# Patient Record
Sex: Male | Born: 1985 | Race: White | Hispanic: No | Marital: Single | Smoking: Never smoker
Health system: Southern US, Community
[De-identification: ages and names within clinical notes are randomized; demographics above are authoritative.]

---

## 2016-04-06 ENCOUNTER — Encounter (HOSPITAL_COMMUNITY): Payer: Self-pay | Admitting: Emergency Medicine

## 2016-04-06 ENCOUNTER — Emergency Department (HOSPITAL_COMMUNITY): Payer: 59

## 2016-04-06 ENCOUNTER — Emergency Department (HOSPITAL_COMMUNITY)
Admission: EM | Admit: 2016-04-06 | Discharge: 2016-04-06 | Disposition: A | Discharge status: Home / Self Care | Payer: 59 | Attending: Emergency Medicine | Admitting: Emergency Medicine

## 2016-04-06 DIAGNOSIS — R1031 Right lower quadrant pain: Secondary | ICD-10-CM | POA: Diagnosis present

## 2016-04-06 DIAGNOSIS — N201 Calculus of ureter: Secondary | ICD-10-CM | POA: Diagnosis not present

## 2016-04-06 LAB — URINALYSIS, ROUTINE W REFLEX MICROSCOPIC
Bilirubin Urine: NEGATIVE
Glucose, UA: NEGATIVE mg/dL
Hgb urine dipstick: NEGATIVE
Ketones, ur: NEGATIVE mg/dL
Leukocytes, UA: NEGATIVE
NITRITE: NEGATIVE
Protein, ur: 30 mg/dL — AB
SPECIFIC GRAVITY, URINE: 1.029 (ref 1.005–1.030)
pH: 8.5 — ABNORMAL HIGH (ref 5.0–8.0)

## 2016-04-06 LAB — URINE MICROSCOPIC-ADD ON

## 2016-04-06 LAB — CBC
HCT: 41.8 % (ref 39.0–52.0)
HEMOGLOBIN: 15 g/dL (ref 13.0–17.0)
MCH: 30.4 pg (ref 26.0–34.0)
MCHC: 35.9 g/dL (ref 30.0–36.0)
MCV: 84.8 fL (ref 78.0–100.0)
Platelets: 204 10*3/uL (ref 150–400)
RBC: 4.93 MIL/uL (ref 4.22–5.81)
RDW: 12.4 % (ref 11.5–15.5)
WBC: 5.9 10*3/uL (ref 4.0–10.5)

## 2016-04-06 LAB — COMPREHENSIVE METABOLIC PANEL
ALK PHOS: 56 U/L (ref 38–126)
ALT: 19 U/L (ref 17–63)
ANION GAP: 12 (ref 5–15)
AST: 24 U/L (ref 15–41)
Albumin: 4.7 g/dL (ref 3.5–5.0)
BILIRUBIN TOTAL: 0.4 mg/dL (ref 0.3–1.2)
BUN: 18 mg/dL (ref 6–20)
CALCIUM: 9.6 mg/dL (ref 8.9–10.3)
CO2: 22 mmol/L (ref 22–32)
Chloride: 105 mmol/L (ref 101–111)
Creatinine, Ser: 1.4 mg/dL — ABNORMAL HIGH (ref 0.61–1.24)
GFR calc non Af Amer: >60 mL/min (ref >60)
Glucose, Bld: 137 mg/dL — ABNORMAL HIGH (ref 65–99)
Potassium: 4.1 mmol/L (ref 3.5–5.1)
SODIUM: 139 mmol/L (ref 135–145)
TOTAL PROTEIN: 7.6 g/dL (ref 6.5–8.1)

## 2016-04-06 LAB — LIPASE, BLOOD: Lipase: 24 U/L (ref 11–51)

## 2016-04-06 MED ORDER — OXYCODONE-ACETAMINOPHEN 5-325 MG PO TABS: 1.0000 | ORAL_TABLET | ORAL | Status: AC | PRN

## 2016-04-06 MED ORDER — HYDROMORPHONE HCL 1 MG/ML IJ SOLN
1.0000 mg | Freq: Once | INTRAMUSCULAR | Status: AC
  Administered 2016-04-06: 1 mg via INTRAVENOUS
  Filled 2016-04-06: qty 1

## 2016-04-06 MED ORDER — TAMSULOSIN HCL 0.4 MG PO CAPS: 0.4000 mg | ORAL_CAPSULE | Freq: Every day | ORAL | Status: AC

## 2016-04-06 MED ORDER — IBUPROFEN 400 MG PO TABS: 400.0000 mg | ORAL_TABLET | Freq: Three times a day (TID) | ORAL | Status: AC | PRN

## 2016-04-06 MED ORDER — SODIUM CHLORIDE 0.9 % IV BOLUS (SEPSIS)
1000.0000 mL | Freq: Once | INTRAVENOUS | Status: AC
  Administered 2016-04-06: 1000 mL via INTRAVENOUS

## 2016-04-06 MED ORDER — ONDANSETRON HCL 4 MG/2ML IJ SOLN
4.0000 mg | Freq: Once | INTRAMUSCULAR | Status: AC | PRN
  Administered 2016-04-06: 4 mg via INTRAVENOUS
  Filled 2016-04-06: qty 2

## 2016-04-06 NOTE — ED Notes (Signed)
Bed: WA16 Expected date:  Expected time:  Means of arrival:  Comments: RES A 

## 2016-04-06 NOTE — ED Notes (Addendum)
MD at bedside. 

## 2016-04-06 NOTE — ED Notes (Signed)
Pt c/o RLQ pain x 1 hour that happened suddenly.  Pt pale, diaphoretic in triage.  Afebrile.  Has been vomiting.  States trouble urinating also.

## 2016-04-06 NOTE — ED Provider Notes (Signed)
CSN: [id number]     Arrival date & time 04/06/16  4098 History   First MD Initiated Contact with Patient 04/06/16 1009     Chief Complaint  Patient presents with  . Abdominal Pain     (Consider location/radiation/quality/duration/timing/severity/associated sxs/prior Treatment) HPI  30 year old male presents with a sudden onset right lower quadrant abdominal pain. Started about 1-1/2 hours ago. Patient also has concomitant right lower back pain. No radiation of the pain. Has had 4 episodes of vomiting as well as a few episodes of diarrhea since it started. Felt like it was difficult to urinate with some pain after this started. Did not know so any blood in his urine. Currently the pain is severe. No prior abdominal history or past medical history.  History reviewed. No pertinent past medical history. History reviewed. No pertinent past surgical history. History reviewed. No pertinent family history. Social History  Substance Use Topics  . Smoking status: Never Smoker   . Smokeless tobacco: None  . Alcohol Use: No    Review of Systems  Constitutional: Negative for fever.  Gastrointestinal: Positive for nausea, vomiting, abdominal pain and diarrhea.  Genitourinary: Negative for hematuria.  Musculoskeletal: Positive for back pain.  All other systems reviewed and are negative.     Allergies  Review of patient's allergies indicates no known allergies.  Home Medications   Prior to Admission medications   Not on File   BP 132/85 mmHg  Pulse 63  Temp(Src) 98.4 F (36.9 C) (Oral)  Resp 16  Wt 170 lb (77.111 kg)  SpO2 100% Physical Exam  Constitutional: He is oriented to person, place, and time. He appears well-developed and well-nourished.  HENT:  Head: Normocephalic and atraumatic.  Right Ear: External ear normal.  Left Ear: External ear normal.  Nose: Nose normal.  Eyes: Right eye exhibits no discharge. Left eye exhibits no discharge.  Neck: Neck supple.   Cardiovascular: Normal rate, regular rhythm, normal heart sounds and intact distal pulses.   Pulmonary/Chest: Effort normal and breath sounds normal.  Abdominal: Soft. There is tenderness in the right lower quadrant. There is no CVA tenderness.  Musculoskeletal: He exhibits no edema.  No tenderness or abnormalities to the back  Neurological: He is alert and oriented to person, place, and time.  Skin: Skin is warm and dry.  Nursing note and vitals reviewed.   ED Course  Procedures (including critical care time) Labs Review Labs Reviewed  COMPREHENSIVE METABOLIC PANEL - Abnormal; Notable for the following:    Glucose, Bld 137 (*)    Creatinine, Ser 1.40 (*)    All other components within normal limits  URINALYSIS, ROUTINE W REFLEX MICROSCOPIC (NOT AT [facility]) - Abnormal; Notable for the following:    APPearance CLOUDY (*)    pH 8.5 (*)    Protein, ur 30 (*)    All other components within normal limits  URINE MICROSCOPIC-ADD ON - Abnormal; Notable for the following:    Squamous Epithelial / LPF 0-5 (*)    Bacteria, UA MANY (*)    All other components within normal limits  URINE CULTURE  LIPASE, BLOOD  CBC    Imaging Review Ct Renal Stone Study  04/06/2016  CLINICAL DATA:  RIGHT lower quadrant pain for 1 hour which initiated subtly. Afebrile. Vomiting. EXAM: CT ABDOMEN AND PELVIS WITHOUT CONTRAST TECHNIQUE: Multidetector CT imaging of the abdomen and pelvis was performed following the standard protocol without IV contrast. COMPARISON:  None. FINDINGS: Lower chest: Lung bases are clear. Hepatobiliary: No  focal hepatic lesion. No biliary duct dilatation. Gallbladder is normal. Common bile duct is normal. Pancreas: Pancreas is normal. No ductal dilatation. No pancreatic inflammation. Spleen: Adrenal glands are normal. Nonobstructing 2 mm calculus in the RIGHT kidney. There is low attenuation and slight enlargement of the RIGHT kidney compared to the LEFT consistent with edema. There is  hydroureter on the RIGHT. This secondary to obstructing calculus in the distal RIGHT ureter measuring 2 mm. This distal RIGHT ureteral calculus is within 1 cm of the vascular junction. Single nonobstructing 1 mm calculus in the LEFT kidney. No LEFT ureterolithiasis. No bladder calculi. Adrenals/urinary tract: Adrenal glands and kidneys are normal. The ureters and bladder normal. Stomach/Bowel: Stomach, small bowel, appendix, and cecum are normal. The colon and rectosigmoid colon are normal. Vascular/Lymphatic: Abdominal aorta is normal caliber. There is no retroperitoneal or periportal lymphadenopathy. No pelvic lymphadenopathy. Reproductive: Normal prostate Other: No free fluid. Musculoskeletal: No aggressive osseous lesion. IMPRESSION: 1. Obstructing calculus within the distal RIGHT ureter within 1 cm of the vesicoureteral junction. 2. Bilateral small nonobstructing renal calculi. Electronically Signed   By: Genevive BiStewart  Edmunds M.D.   On: 04/06/2016 11:49   I have personally reviewed and evaluated these images and lab results as part of my medical decision-making.   EKG Interpretation None      MDM   Final diagnoses:  Right ureteral stone    Patient feels much better after IV pain control. No vomiting in ED. CT confirms right ureteral stone. Appendix visualized and unremarkable. Will give a urine strainer, flomax and narcotic pain control. Given size he should be able to pass it. He has bacteria on UA but no leukocytes or nitrites. Reports no dysuria when he urinated in ED. Will culture but this does not appear to be a UTI. F/u with urology. Discussed return precautions.    Pricilla LovelessScott Mishelle Hassan, MD 04/06/16 [phone]

## 2016-04-06 NOTE — ED Notes (Signed)
Patient transported to CT 

## 2016-04-06 NOTE — ED Notes (Signed)
Pt returned from CT °

## 2016-04-07 LAB — URINE CULTURE: Culture: NO GROWTH

## 2017-03-03 IMAGING — CT CT RENAL STONE PROTOCOL
2 of 3 series · 16 of 46 positions shown, 18 images · non-contrast
Comparison: None.

CLINICAL DATA: RIGHT lower quadrant pain for 1 hour which initiated
subtly. Afebrile. Vomiting.

EXAM:
CT ABDOMEN AND PELVIS WITHOUT CONTRAST
TECHNIQUE: Multidetector CT imaging of the abdomen and pelvis was performed
following the standard protocol without IV contrast.

[Series 3: coronal · coronal · 0.74mm/px · 3 of 128 slices shown]
[im 43/128  soft-tissue]
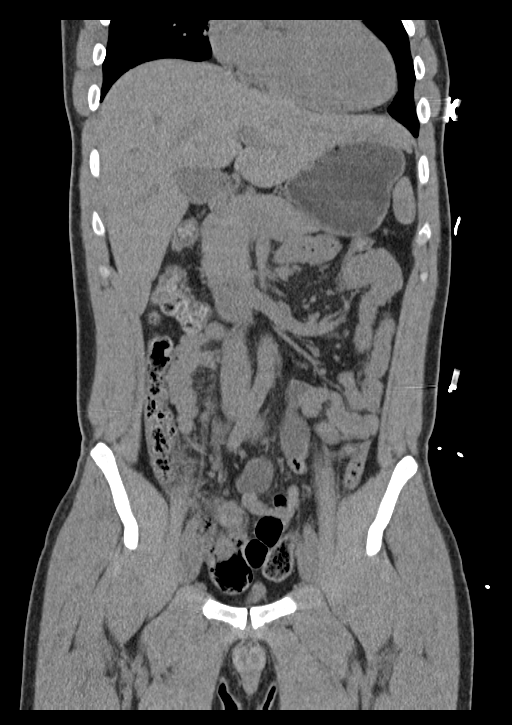
[im 57/128  soft-tissue]
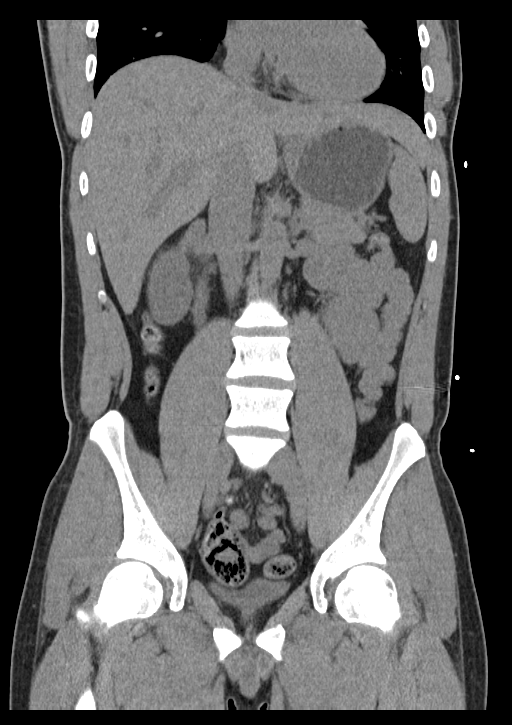
[im 71/128  soft-tissue]
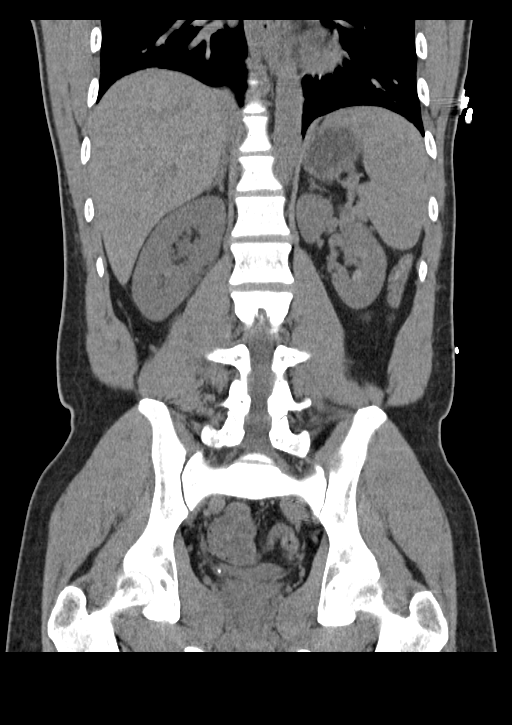

[Series 6: lung · axial · 0.65mm/px · z∈[+1514,+1622]mm · 13 of 64 slices shown, 15 images]
[im 5/64  soft-tissue]
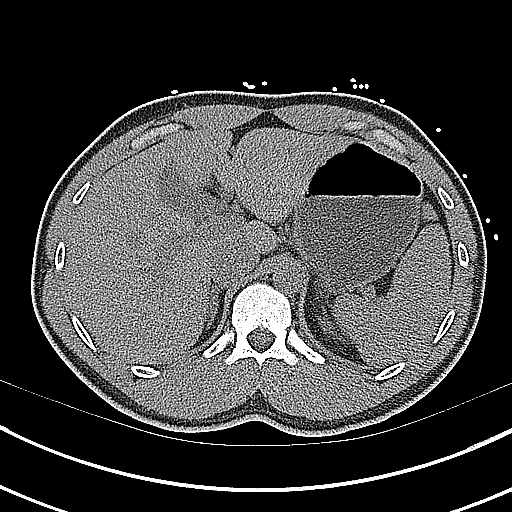
[im 5/64  bone]
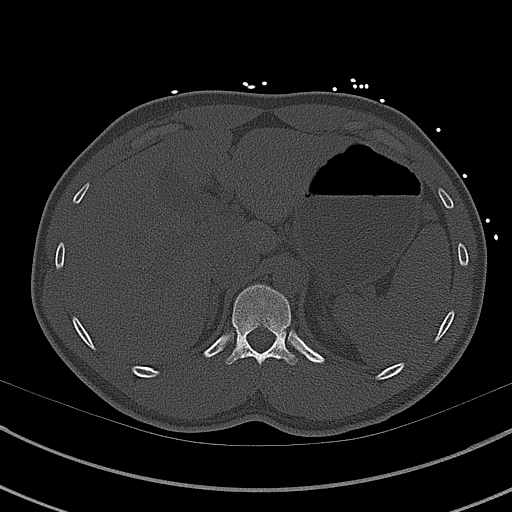
[im 9/64  soft-tissue]
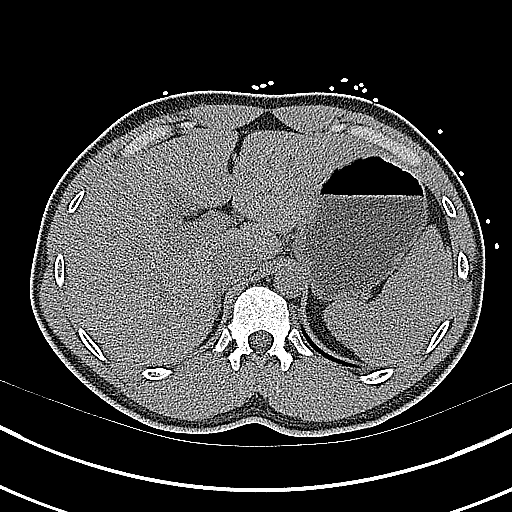
[im 13/64  soft-tissue]
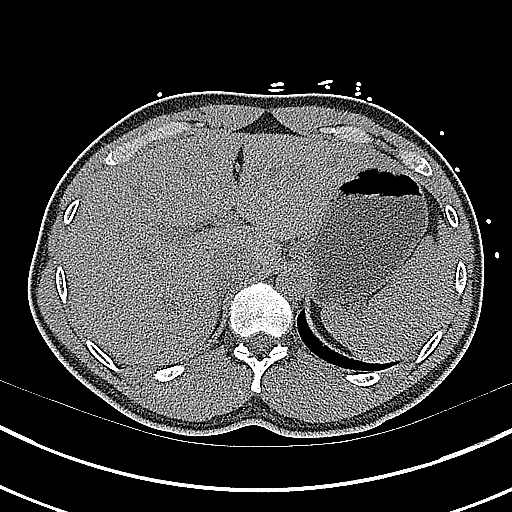
[im 19/64  soft-tissue]
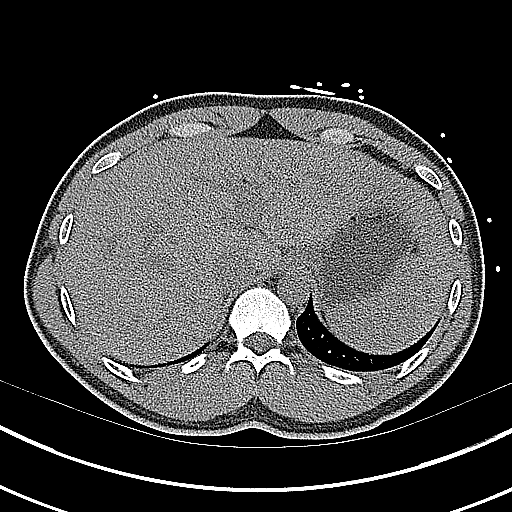
[im 23/64  soft-tissue]
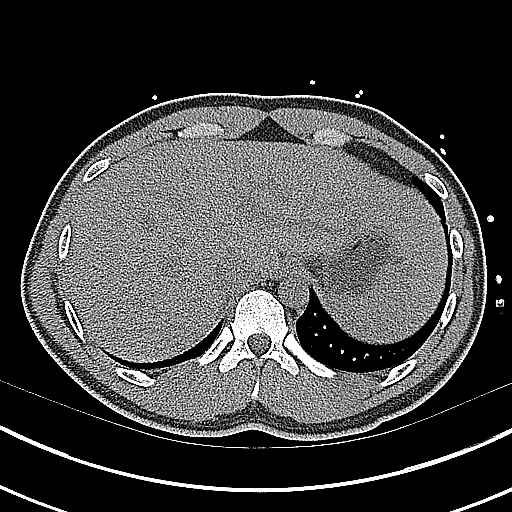
[im 27/64  soft-tissue]
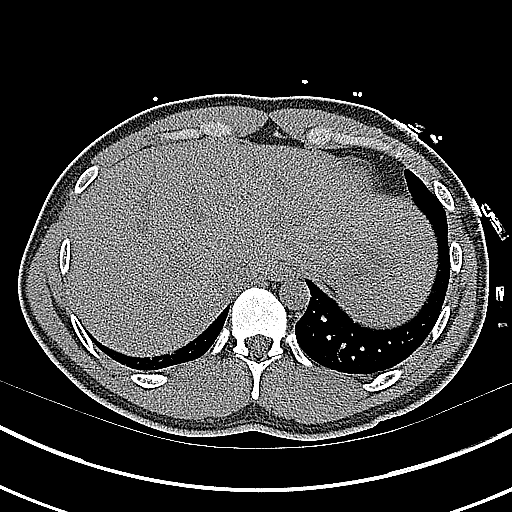
[im 33/64  soft-tissue]
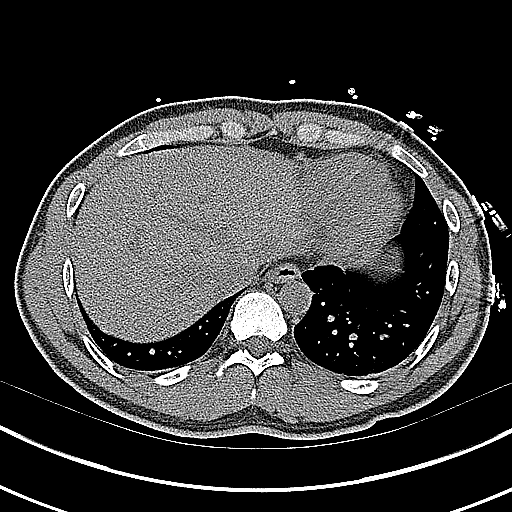
[im 37/64  soft-tissue]
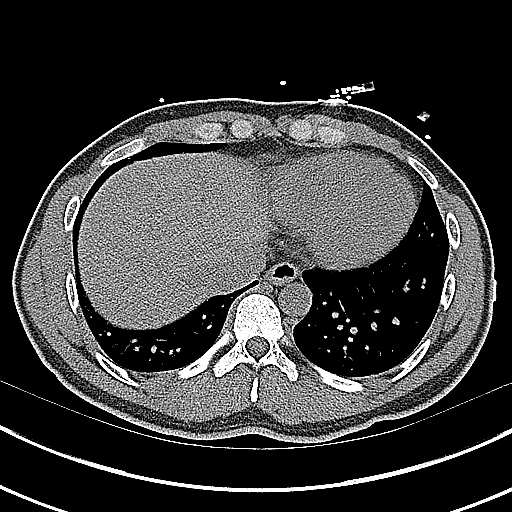
[im 41/64  soft-tissue]
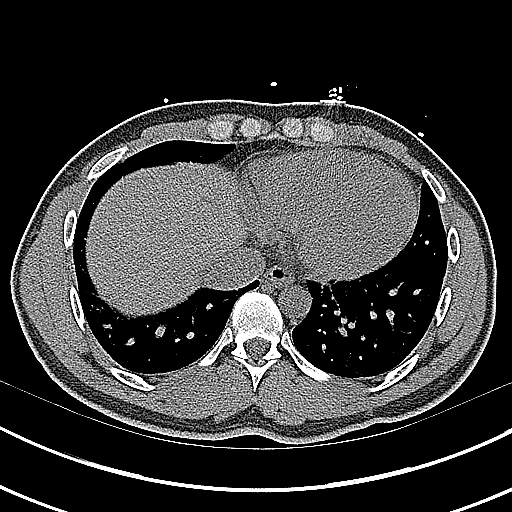
[im 41/64  bone]
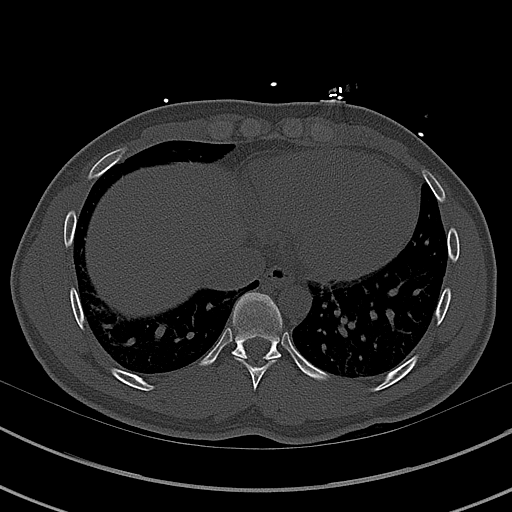
[im 45/64  soft-tissue]
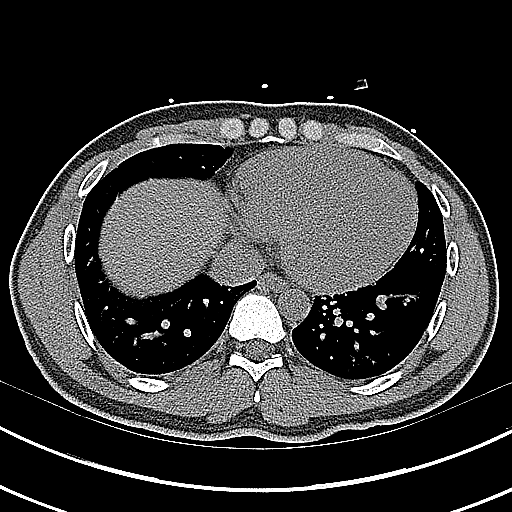
[im 51/64  soft-tissue]
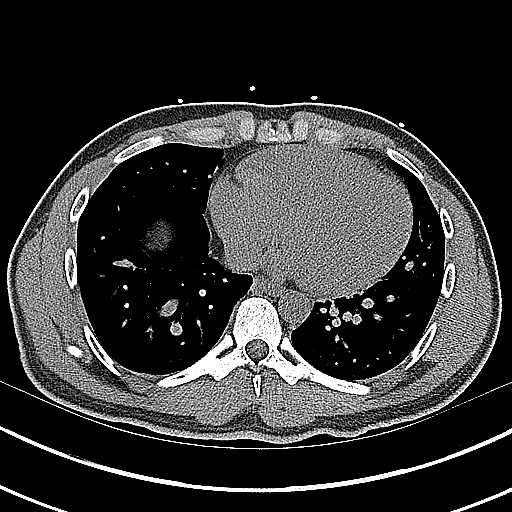
[im 55/64  soft-tissue]
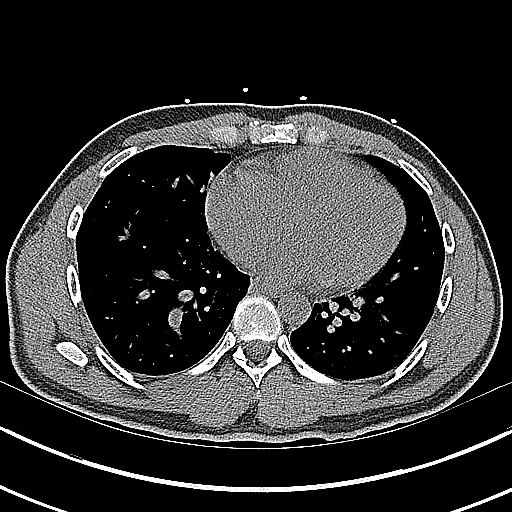
[im 59/64  soft-tissue]
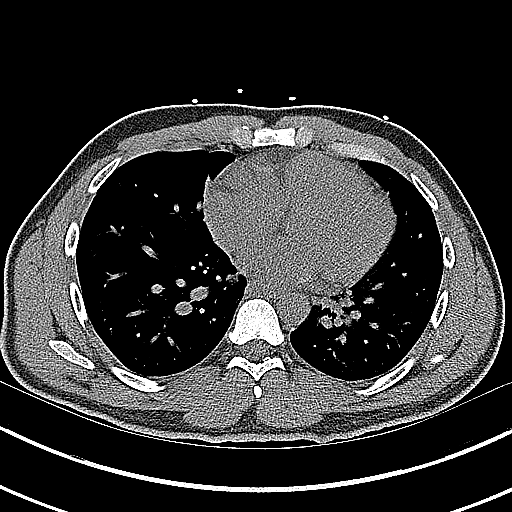

[16 of 46 positions shown; findings below may reference images not displayed]

FINDINGS: Lower chest: Lung bases are clear.

Hepatobiliary: No focal hepatic lesion. No biliary duct dilatation.
Gallbladder is normal. Common bile duct is normal.

Pancreas: Pancreas is normal. No ductal dilatation. No pancreatic
inflammation.

Spleen: Adrenal glands are normal.

Nonobstructing 2 mm calculus in the RIGHT kidney. There is low
attenuation and slight enlargement of the RIGHT kidney compared to
the LEFT consistent with edema. There is hydroureter on the RIGHT.
This secondary to obstructing calculus in the distal RIGHT ureter
measuring 2 mm. This distal RIGHT ureteral calculus is within 1 cm
of the vascular junction.

Single nonobstructing 1 mm calculus in the LEFT kidney. No LEFT
ureterolithiasis. No bladder calculi.

Adrenals/urinary tract: Adrenal glands and kidneys are normal. The
ureters and bladder normal.

Stomach/Bowel: Stomach, small bowel, appendix, and cecum are normal.
The colon and rectosigmoid colon are normal.

Vascular/Lymphatic: Abdominal aorta is normal caliber. There is no
retroperitoneal or periportal lymphadenopathy. No pelvic
lymphadenopathy.

Reproductive: Normal prostate

Other: No free fluid.

Musculoskeletal: No aggressive osseous lesion.
IMPRESSION: 1. Obstructing calculus within the distal RIGHT ureter within 1 cm
of the vesicoureteral junction.
2. Bilateral small nonobstructing renal calculi.
